# Patient Record
Sex: Female | Born: 1980 | Race: Black or African American | Hispanic: No | Marital: Married | Smoking: Never smoker
Health system: Southern US, Community
[De-identification: ages and names within clinical notes are randomized; demographics above are authoritative.]

## PROBLEM LIST (undated history)

## (undated) DIAGNOSIS — Z6835 Body mass index (BMI) 35.0-35.9, adult: Secondary | ICD-10-CM

## (undated) HISTORY — PX: THYROIDECTOMY: SHX17

## (undated) HISTORY — DX: Body mass index (BMI) 35.0-35.9, adult: Z68.35

---

## 2020-05-31 ENCOUNTER — Encounter (HOSPITAL_COMMUNITY): Payer: Self-pay

## 2020-05-31 ENCOUNTER — Emergency Department (HOSPITAL_COMMUNITY)
Admission: EM | Admit: 2020-05-31 | Discharge: 2020-05-31 | Disposition: A | Discharge status: Home / Self Care | Payer: BC Managed Care – PPO | Attending: Emergency Medicine | Admitting: Emergency Medicine

## 2020-05-31 ENCOUNTER — Other Ambulatory Visit: Payer: Self-pay

## 2020-05-31 DIAGNOSIS — U071 COVID-19: Secondary | ICD-10-CM | POA: Diagnosis not present

## 2020-05-31 DIAGNOSIS — R509 Fever, unspecified: Secondary | ICD-10-CM | POA: Diagnosis present

## 2020-05-31 LAB — RESP PANEL BY RT-PCR (FLU A&B, COVID) ARPGX2
Influenza A by PCR: NEGATIVE
Influenza B by PCR: NEGATIVE
SARS Coronavirus 2 by RT PCR: POSITIVE — AB

## 2020-05-31 MED ORDER — ACETAMINOPHEN 325 MG PO TABS
650.0000 mg | ORAL_TABLET | Freq: Once | ORAL | Status: AC | PRN
  Administered 2020-05-31: 650 mg via ORAL
  Filled 2020-05-31: qty 2

## 2020-05-31 NOTE — Discharge Instructions (Signed)
You are seen in the emergency department for Covid-like symptoms.  You tested positive for Covid.  You will need to isolate for 10 days.  We placed a referral into the infusion center and they may reach out to you in the next day or 2 to set up an antibody infusion.  There is also a Covid clinic at [facility].  Please drink plenty of fluids, rest, Tylenol as needed for fever and pain.  Return to the emergency department if any worsening or concerning symptoms

## 2020-05-31 NOTE — ED Provider Notes (Signed)
[facility] EMERGENCY DEPARTMENT Provider Note   CSN: [id number] Arrival date & time: 05/31/20  1134     History Chief Complaint  Patient presents with  . Fever  . Generalized Body Aches    Darlene Osborn is a 39 y.o. female.  She is here with complaint of a possible sinus infection that started yesterday.  She is got pain over her left eye along with fever to 102, body aches, nasal congestion.  Has tried nothing for it.  She said she gets sinus infection once a year.  Unfortunately she is also not Covid vaccinated was febrile in triage and given Tylenol.  The history is provided by the patient.  URI Presenting symptoms: congestion, facial pain, fever, rhinorrhea and sore throat   Presenting symptoms: no cough   Fever:    Duration:  2 days   Timing:  Intermittent   Max temp prior to arrival:  102.2   Temp source:  Oral   Progression:  Unchanged Severity:  Mild Onset quality:  Gradual Timing:  Intermittent Progression:  Unchanged Chronicity:  New Relieved by:  None tried Worsened by:  Nothing Ineffective treatments:  None tried Associated symptoms: sinus pain   Associated symptoms: no neck pain   Risk factors: no sick contacts        History reviewed. No pertinent past medical history.  There are no problems to display for this patient.   History reviewed. No pertinent surgical history.   OB History   No obstetric history on file.     No family history on file.     Home Medications Prior to Admission medications   Not on File    Allergies    Patient has no allergy information on record.  Review of Systems   Review of Systems  Constitutional: Positive for chills and fever.  HENT: Positive for congestion, rhinorrhea, sinus pain and sore throat.   Eyes: Negative for visual disturbance.  Respiratory: Negative for cough.   Cardiovascular: Negative for chest pain.  Gastrointestinal: Negative for diarrhea.  Genitourinary: Negative  for dysuria.  Musculoskeletal: Negative for neck pain.  Skin: Negative for rash.  Neurological: Negative for syncope.    Physical Exam Updated Vital Signs BP 115/70   Pulse 81   Temp (!) 102.2 F (39 C) (Oral)   Resp 16   Ht 5\' 3"  (1.6 m)   Wt 90.7 kg   SpO2 96%   BMI 35.43 kg/m   Physical Exam Vitals and nursing note reviewed.  Constitutional:      General: She is not in acute distress.    Appearance: Normal appearance. She is well-developed and well-nourished.  HENT:     Head: Normocephalic and atraumatic.     Nose: Congestion present.     Mouth/Throat:     Mouth: Oropharynx is clear and moist. Mucous membranes are moist.     Pharynx: No oropharyngeal exudate or posterior oropharyngeal erythema.  Eyes:     Conjunctiva/sclera: Conjunctivae normal.  Cardiovascular:     Rate and Rhythm: Normal rate and regular rhythm.     Pulses: Intact distal pulses.     Heart sounds: No murmur heard.   Pulmonary:     Effort: Pulmonary effort is normal. No respiratory distress.     Breath sounds: Normal breath sounds. No stridor. No wheezing.  Abdominal:     Palpations: Abdomen is soft.     Tenderness: There is no abdominal tenderness.  Musculoskeletal:  General: No tenderness or edema. Normal range of motion.     Cervical back: Neck supple.  Skin:    General: Skin is warm and dry.     Capillary Refill: Capillary refill takes less than 2 seconds.  Neurological:     General: No focal deficit present.     Mental Status: She is alert.     GCS: GCS eye subscore is 4. GCS verbal subscore is 5. GCS motor subscore is 6.  Psychiatric:        Mood and Affect: Mood and affect normal.     ED Results / Procedures / Treatments   Labs (all labs ordered are listed, but only abnormal results are displayed) Labs Reviewed  RESP PANEL BY RT-PCR (FLU A&B, COVID) ARPGX2 - Abnormal; Notable for the following components:      Result Value   SARS Coronavirus 2 by RT PCR POSITIVE (*)     All other components within normal limits    EKG None  Radiology No results found.  Procedures Procedures (including critical care time)  Medications Ordered in ED Medications  acetaminophen (TYLENOL) tablet 650 mg (650 mg Oral Given 05/31/20 1145)    ED Course  I have reviewed the triage vital signs and the nursing notes.  Pertinent labs & imaging results that were available during my care of the patient were reviewed by me and considered in my medical decision making (see chart for details).    MDM Rules/Calculators/A&P                         Darlene Osborn was evaluated in Emergency Department on 05/31/2020 for the symptoms described in the history of present illness. She was evaluated in the context of the global COVID-19 pandemic, which necessitated consideration that the patient might be at risk for infection with the SARS-CoV-2 virus that causes COVID-19. Institutional protocols and algorithms that pertain to the evaluation of patients at risk for COVID-19 are in a state of rapid change based on information released by regulatory bodies including the CDC and federal and state organizations. These policies and algorithms were followed during the patient's care in the ED.  Differential diagnosis includes Covid, pneumonia, viral syndrome, influenza. Final Clinical Impression(s) / ED Diagnoses Final diagnoses:  COVID-19 virus infection    Rx / DC Orders ED Discharge Orders    None       Darlene Files, MD 05/31/20 2015

## 2020-05-31 NOTE — ED Triage Notes (Signed)
Pt reports generalized body aches, congestion, chills and fever since yesterday. Pt is not vaccinated for COVID. Febrile in triage

## 2020-05-31 NOTE — ED Notes (Signed)
Patient verbalizes understanding of discharge instructions. Opportunity for questioning and answers were provided. Pt discharged from ED. 

## 2020-06-01 ENCOUNTER — Encounter: Payer: Self-pay | Admitting: Physician Assistant

## 2020-06-08 ENCOUNTER — Other Ambulatory Visit: Payer: Self-pay

## 2020-06-08 ENCOUNTER — Emergency Department (HOSPITAL_COMMUNITY): Payer: BC Managed Care – PPO

## 2020-06-08 ENCOUNTER — Emergency Department (HOSPITAL_COMMUNITY)
Admission: EM | Admit: 2020-06-08 | Discharge: 2020-06-09 | Disposition: A | Discharge status: Home / Self Care | Payer: BC Managed Care – PPO | Attending: Emergency Medicine | Admitting: Emergency Medicine

## 2020-06-08 ENCOUNTER — Encounter (HOSPITAL_COMMUNITY): Payer: Self-pay | Admitting: Emergency Medicine

## 2020-06-08 DIAGNOSIS — R42 Dizziness and giddiness: Secondary | ICD-10-CM

## 2020-06-08 DIAGNOSIS — E860 Dehydration: Secondary | ICD-10-CM | POA: Insufficient documentation

## 2020-06-08 DIAGNOSIS — U071 COVID-19: Secondary | ICD-10-CM | POA: Diagnosis not present

## 2020-06-08 LAB — CBC WITH DIFFERENTIAL/PLATELET
Abs Immature Granulocytes: 0.01 10*3/uL (ref 0.00–0.07)
Basophils Absolute: 0 10*3/uL (ref 0.0–0.1)
Basophils Relative: 0 %
Eosinophils Absolute: 0 10*3/uL (ref 0.0–0.5)
Eosinophils Relative: 0 %
HCT: 41 % (ref 36.0–46.0)
Hemoglobin: 13.2 g/dL (ref 12.0–15.0)
Immature Granulocytes: 0 %
Lymphocytes Relative: 40 %
Lymphs Abs: 1.7 10*3/uL (ref 0.7–4.0)
MCH: 23.7 pg — ABNORMAL LOW (ref 26.0–34.0)
MCHC: 32.2 g/dL (ref 30.0–36.0)
MCV: 73.5 fL — ABNORMAL LOW (ref 80.0–100.0)
Monocytes Absolute: 0.3 10*3/uL (ref 0.1–1.0)
Monocytes Relative: 8 %
Neutro Abs: 2.1 10*3/uL (ref 1.7–7.7)
Neutrophils Relative %: 52 %
Platelets: 211 10*3/uL (ref 150–400)
RBC: 5.58 MIL/uL — ABNORMAL HIGH (ref 3.87–5.11)
RDW: 15.8 % — ABNORMAL HIGH (ref 11.5–15.5)
WBC: 4.2 10*3/uL (ref 4.0–10.5)
nRBC: 0 % (ref 0.0–0.2)

## 2020-06-08 LAB — COMPREHENSIVE METABOLIC PANEL
ALT: 32 U/L (ref 0–44)
AST: 28 U/L (ref 15–41)
Albumin: 3.4 g/dL — ABNORMAL LOW (ref 3.5–5.0)
Alkaline Phosphatase: 55 U/L (ref 38–126)
Anion gap: 10 (ref 5–15)
BUN: 12 mg/dL (ref 6–20)
CO2: 24 mmol/L (ref 22–32)
Calcium: 8 mg/dL — ABNORMAL LOW (ref 8.9–10.3)
Chloride: 101 mmol/L (ref 98–111)
Creatinine, Ser: 0.83 mg/dL (ref 0.44–1.00)
GFR, Estimated: >60 mL/min (ref >60)
Glucose, Bld: 117 mg/dL — ABNORMAL HIGH (ref 70–99)
Potassium: 3.2 mmol/L — ABNORMAL LOW (ref 3.5–5.1)
Sodium: 135 mmol/L (ref 135–145)
Total Bilirubin: 0.5 mg/dL (ref 0.3–1.2)
Total Protein: 6.3 g/dL — ABNORMAL LOW (ref 6.5–8.1)

## 2020-06-08 NOTE — ED Triage Notes (Signed)
Pt c/o continued dizziness, fever, body aches. Covid +, dx 12/25.

## 2020-06-09 DIAGNOSIS — U071 COVID-19: Secondary | ICD-10-CM | POA: Diagnosis not present

## 2020-06-09 MED ORDER — LACTATED RINGERS IV BOLUS
1000.0000 mL | Freq: Once | INTRAVENOUS | Status: AC
  Administered 2020-06-09: 1000 mL via INTRAVENOUS

## 2020-06-09 MED ORDER — ACETAMINOPHEN 500 MG PO TABS
1000.0000 mg | ORAL_TABLET | Freq: Once | ORAL | Status: AC
  Administered 2020-06-09: 1000 mg via ORAL
  Filled 2020-06-09: qty 2

## 2020-06-09 MED ORDER — POTASSIUM CHLORIDE CRYS ER 20 MEQ PO TBCR
40.0000 meq | EXTENDED_RELEASE_TABLET | Freq: Once | ORAL | Status: AC
  Administered 2020-06-09: 40 meq via ORAL
  Filled 2020-06-09: qty 2

## 2020-06-09 MED ORDER — ACETAMINOPHEN 325 MG PO TABS
650.0000 mg | ORAL_TABLET | Freq: Once | ORAL | Status: AC
  Administered 2020-06-09: 650 mg via ORAL
  Filled 2020-06-09: qty 2

## 2020-06-09 NOTE — ED Notes (Signed)
Patient Alert and oriented to baseline. Stable and ambulatory to baseline. Patient verbalized understanding of the discharge instructions.  Patient belongings were taken by the patient.   

## 2020-06-09 NOTE — ED Notes (Signed)
Pt oxygen remained 98% and above while ambulating. Pt states she feels much better.

## 2020-06-09 NOTE — ED Provider Notes (Signed)
[facility] EMERGENCY DEPARTMENT Provider Note   CSN: [id number] Arrival date & time: 06/08/20  2139     History Chief Complaint  Patient presents with  . Dizziness    Darlene Osborn is a 40 y.o. female with a past medical history of obesity who presents today for evaluation of 9 days of COVID-19 symptoms.  She developed symptoms on 12/24.  She did not get vaccinated.  She states that she was given paperwork saying that she can go back tomorrow however she has 1/2-hour commute and feels lightheaded and does not feel safe to drive that distance.  She states that she has been unable to clearly identify any triggering factors.  She denies nausea vomiting or diarrhea however does note overall poor appetite stating that she will eat 2 bites: Drink 2 bites and then has no more appetite.  She denies any leg swelling.  She denies any personal history of DVT/PE.  No significant chest pain or shortness of breath.  She reports she is continuing to have fevers.  She feels like she is dehydrated.  Chart review shows that on 03/28/2020, her last visit that I can see prior to her developing Covid, at that point her blood pressure was 104/65.    HPI     Past Medical History:  Diagnosis Date  . BMI 35.0-35.9,adult     There are no problems to display for this patient.   History reviewed. No pertinent surgical history.   OB History   No obstetric history on file.     No family history on file.  Social History   Tobacco Use  . Smoking status: Never Smoker  . Smokeless tobacco: Never Used  Substance Use Topics  . Alcohol use: Not Currently  . Drug use: Not Currently    Home Medications Prior to Admission medications   Medication Sig Start Date End Date Taking? Authorizing Provider  acetaminophen (TYLENOL) 500 MG tablet Take 1,000 mg by mouth daily.   Yes [provider]  levonorgestrel (MIRENA) 20 MCG/24HR IUD 1 each by Intrauterine route once.   Yes  [provider]    Allergies    Iodine  Review of Systems   Review of Systems  Constitutional: Positive for appetite change, chills, fatigue and fever.  Respiratory: Positive for cough. Negative for chest tightness and shortness of breath.   Cardiovascular: Negative for chest pain, palpitations and leg swelling.  Gastrointestinal: Negative for abdominal pain, diarrhea and nausea.  Musculoskeletal: Positive for arthralgias and myalgias.  Skin: Negative for color change and rash.  Neurological: Positive for light-headedness. Negative for dizziness, seizures, syncope, weakness and headaches.  All other systems reviewed and are negative.   Physical Exam Updated Vital Signs BP 105/62   Pulse 78   Temp 98.5 F (36.9 C) (Oral)   Resp 17   SpO2 100%   Physical Exam Vitals and nursing note reviewed.  Constitutional:      General: She is not in acute distress.    Appearance: She is not ill-appearing or diaphoretic.  HENT:     Head: Normocephalic and atraumatic.  Eyes:     General: No scleral icterus.       Right eye: No discharge.        Left eye: No discharge.     Conjunctiva/sclera: Conjunctivae normal.  Cardiovascular:     Rate and Rhythm: Normal rate and regular rhythm.     Pulses: Normal pulses.     Heart sounds: Normal heart  sounds.  Pulmonary:     Effort: Pulmonary effort is normal. No respiratory distress.     Breath sounds: Normal breath sounds. No stridor.  Chest:     Chest wall: No tenderness.  Abdominal:     General: There is no distension.     Tenderness: There is no abdominal tenderness. There is no guarding.  Musculoskeletal:        General: No deformity.     Cervical back: Normal range of motion and neck supple.     Right lower leg: No edema.     Left lower leg: No edema.  Skin:    General: Skin is warm and dry.  Neurological:     General: No focal deficit present.     Mental Status: She is alert.     Motor: No abnormal muscle tone.      Comments: Awake and alert, answers all questions appropriately.  Speech is not slurred.  Psychiatric:        Mood and Affect: Mood normal.        Behavior: Behavior normal.     ED Results / Procedures / Treatments   Labs (all labs ordered are listed, but only abnormal results are displayed) Labs Reviewed  CBC WITH DIFFERENTIAL/PLATELET - Abnormal; Notable for the following components:      Result Value   RBC 5.58 (*)    MCV 73.5 (*)    MCH 23.7 (*)    RDW 15.8 (*)    All other components within normal limits  COMPREHENSIVE METABOLIC PANEL - Abnormal; Notable for the following components:   Potassium 3.2 (*)    Glucose, Bld 117 (*)    Calcium 8.0 (*)    Total Protein 6.3 (*)    Albumin 3.4 (*)    All other components within normal limits    EKG None  Radiology DG Chest Portable 1 View  Result Date: 06/08/2020 CLINICAL DATA:  Dizziness EXAM: PORTABLE CHEST 1 VIEW COMPARISON:  None. FINDINGS: The heart size and mediastinal contours are within normal limits. Both lungs are clear. The visualized skeletal structures are unremarkable. IMPRESSION: No active disease. Electronically Signed   By: Jasmine Pang M.D.   On: 06/08/2020 22:23    Procedures Procedures (including critical care time)  Medications Ordered in ED Medications  acetaminophen (TYLENOL) tablet 650 mg (650 mg Oral Given 06/09/20 0037)  lactated ringers bolus 1,000 mL (0 mLs Intravenous Stopped 06/09/20 1516)  potassium chloride SA (KLOR-CON) CR tablet 40 mEq (40 mEq Oral Given 06/09/20 1249)  acetaminophen (TYLENOL) tablet 1,000 mg (1,000 mg Oral Given 06/09/20 1249)    ED Course  I have reviewed the triage vital signs and the nursing notes.  Pertinent labs & imaging results that were available during my care of the patient were reviewed by me and considered in my medical decision making (see chart for details).    MDM Rules/Calculators/A&P                         Darlene Osborn was evaluated in Emergency  Department on 06/09/2020 for the symptoms described in the history of present illness. She was evaluated in the context of the global COVID-19 pandemic, which necessitated consideration that the patient might be at risk for infection with the SARS-CoV-2 virus that causes COVID-19. Institutional protocols and algorithms that pertain to the evaluation of patients at risk for COVID-19 are in a state of rapid change based on information released by regulatory  bodies including the CDC and federal and state organizations. These policies and algorithms were followed during the patient's care in the ED.  Patient is a 40 year old woman who presents today for evaluation of lightheadedness.  She is on day 9 of COVID-19 symptoms and did not get vaccinated.  At the time of my evaluation she has been in the waiting room for over 14 hours. She does have intermittent tachycardia noted, however this appears to be when her temperature is higher and she is still febrile.  Chest x-ray without consolidation, pneumothorax or other acute abnormality.  Labs obtained show mild hypokalemia with potassium of 3.2 which is orally repleted.  Suspect this is due to poor p.o. intake.  Suspect that patient is dehydrated in the setting of poor p.o. intake.  Patient treated with tylenol, PO intake, IV fluids and PO potassium.    After IV fluids and treatment patient reported feeling much better.  She was able to ambulate on pulse ox on room air with out desaturations or dyspnea.    Return precautions were discussed with patient who states their understanding.  At the time of discharge patient denied any unaddressed complaints or concerns.  Patient is agreeable for discharge home.  Note: Portions of this report may have been transcribed using voice recognition software. Every effort was made to ensure accuracy; however, inadvertent computerized transcription errors may be present   Final Clinical Impression(s) / ED Diagnoses Final  diagnoses:  COVID-19  Lightheadedness  Dehydration    Rx / DC Orders ED Discharge Orders    None       Norman Clay 06/09/20 2139    Rolan Bucco, MD 06/12/20 1645

## 2020-06-09 NOTE — Discharge Instructions (Addendum)
Please make sure you are drinking plenty of water at home to stay hydrated. Today your potassium was slightly low which is most likely due to not eating and drinking well.  Even if you do not feel hungry or thirsty it is important that you are still trying to eat and drink.  If your symptoms worsen, you develop significant shortness of breath, chest pain, passout or have any other concerns please seek additional medical care and evaluation.  Please take Tylenol (acetaminophen) to relieve your pain/fever.  You may take tylenol, up to 1,000 mg (two extra strength pills).  Do not take more than 3,000 mg tylenol in a 24 hour period.  Please check all medication labels as many medications such as pain and cold medications may contain tylenol. Please do not drink alcohol while taking this medication.

## 2020-06-10 ENCOUNTER — Telehealth: Payer: Self-pay | Admitting: General Practice

## 2020-06-10 NOTE — Telephone Encounter (Signed)
[facility] [phone] called pt LVM to schedule HFU appt.

## 2021-08-05 IMAGING — DX DG CHEST 1V PORT
1 series · 1 of 1 positions shown · non-contrast
Comparison: None.

CLINICAL DATA: Dizziness

EXAM:
PORTABLE CHEST 1 VIEW

[chest ap]
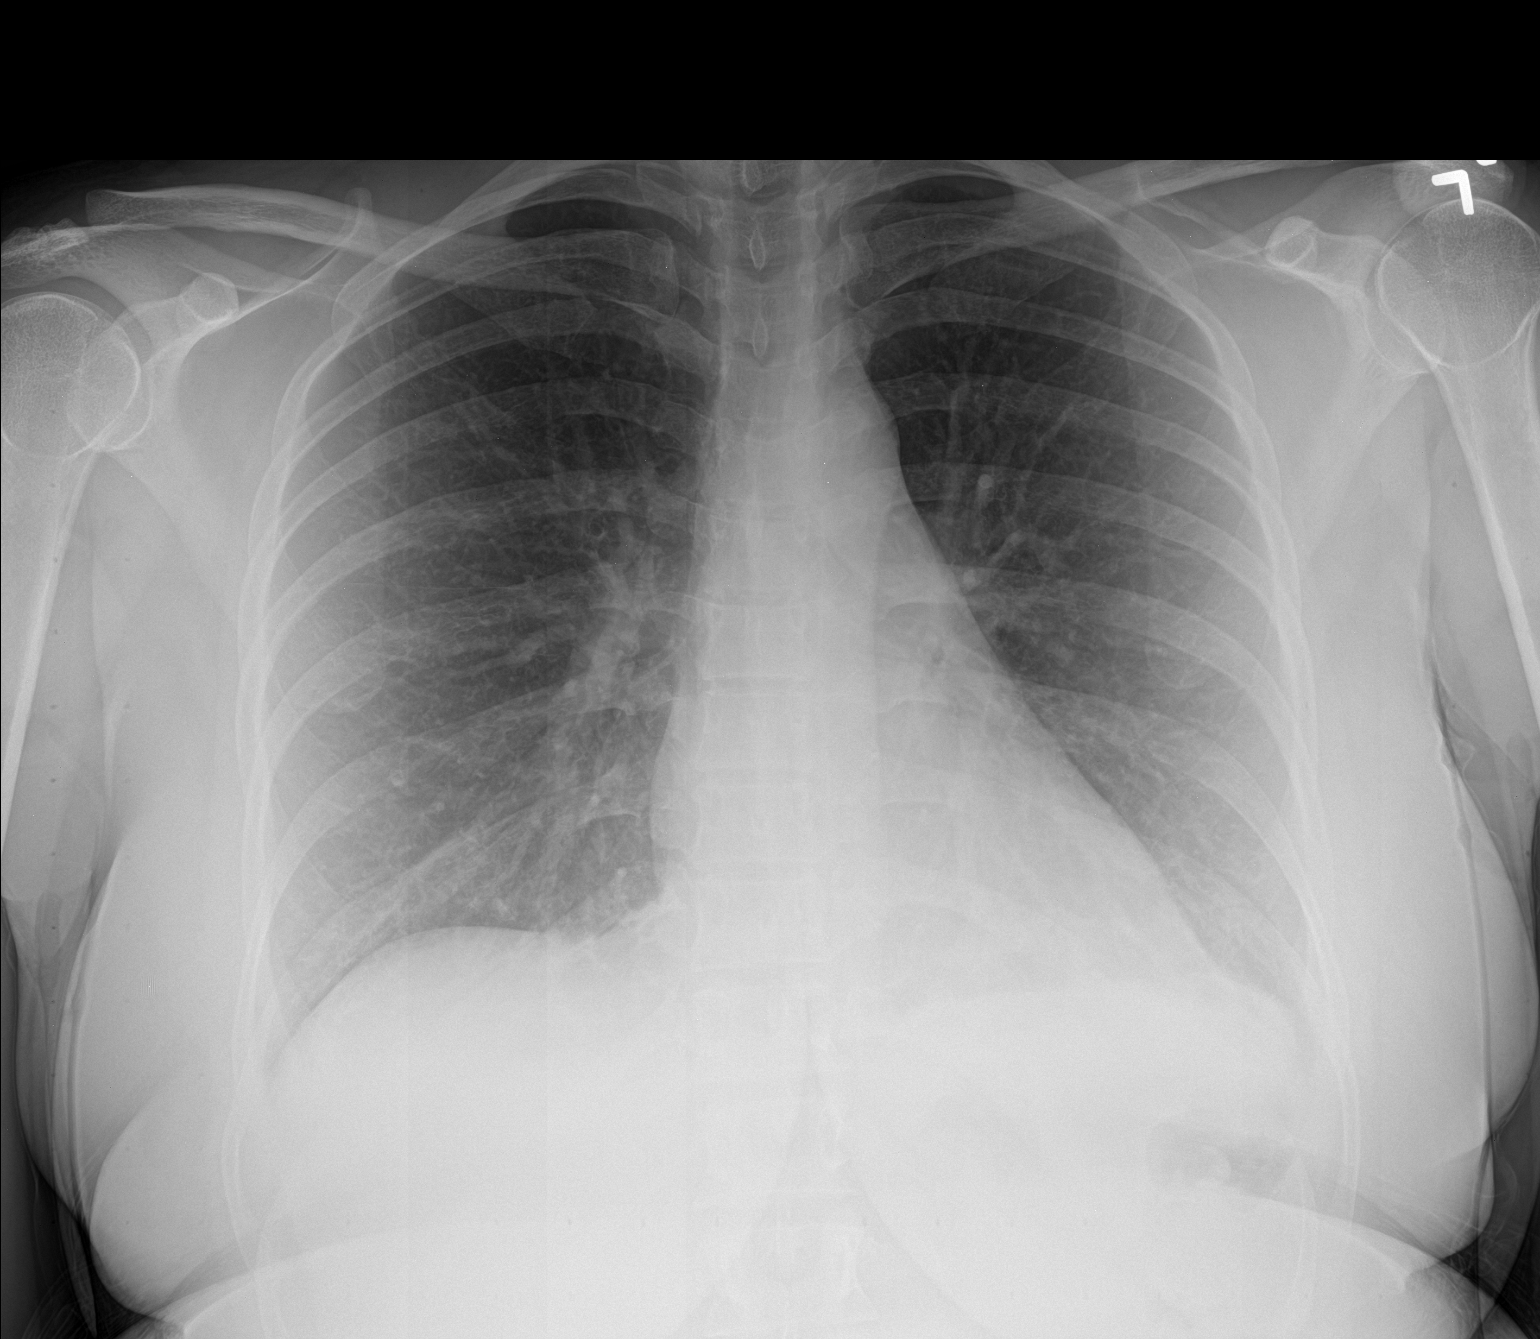

[1 of 1 positions shown; findings below may reference images not displayed]

FINDINGS: The heart size and mediastinal contours are within normal limits.
Both lungs are clear. The visualized skeletal structures are
unremarkable.
IMPRESSION: No active disease.

## 2021-11-28 ENCOUNTER — Encounter (HOSPITAL_COMMUNITY): Payer: Self-pay | Admitting: Emergency Medicine

## 2021-11-28 ENCOUNTER — Ambulatory Visit (HOSPITAL_COMMUNITY)
Admission: EM | Admit: 2021-11-28 | Discharge: 2021-11-28 | Disposition: A | Discharge status: Home / Self Care | Payer: BC Managed Care – PPO | Attending: Emergency Medicine | Admitting: Emergency Medicine

## 2021-11-28 DIAGNOSIS — M79671 Pain in right foot: Secondary | ICD-10-CM | POA: Diagnosis not present

## 2021-11-28 MED ORDER — KETOROLAC TROMETHAMINE 30 MG/ML IJ SOLN
INTRAMUSCULAR | Status: AC
  Filled 2021-11-28: qty 1

## 2021-11-28 MED ORDER — CYCLOBENZAPRINE HCL 10 MG PO TABS: 10.0000 mg | ORAL_TABLET | Freq: Every day | ORAL | 0 refills | Status: AC

## 2021-11-28 MED ORDER — PREDNISONE 20 MG PO TABS: 40.0000 mg | ORAL_TABLET | Freq: Every day | ORAL | 0 refills | Status: AC

## 2021-11-28 MED ORDER — KETOROLAC TROMETHAMINE 30 MG/ML IJ SOLN
30.0000 mg | Freq: Once | INTRAMUSCULAR | Status: AC
  Administered 2021-11-28: 30 mg via INTRAMUSCULAR

## 2021-11-28 NOTE — ED Triage Notes (Addendum)
Right foot pain to dorsal/anterior side of foot starting two days ago with no known causative factor. 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 Pain started suddenly, but progressed gradually. Antalgic gait present. Patient requests to hold off on x-ray until evaluated by provider.

## 2021-11-28 NOTE — ED Provider Notes (Signed)
[facility]    CSN: [id number] Arrival date & time: 11/28/21  1026      History   Chief Complaint Chief Complaint  Patient presents with   Foot Pain    HPI Darlene Osborn is a 41 y.o. female.   Patient presents with right foot pain beginning 2 days ago without precipitating event or trauma.  Pain is present on the dorsum of the foot.  endorses that symptoms are predominantly present when walking and becomes significantly milder when at rest.  Able to bear weight.  Pain can be felt with range of motion of toes.  Has attempted use of Goody's powder which has not been helpful.  Denies numbness or tingling .  Past Medical History:  Diagnosis Date   BMI 35.0-35.9,adult     There are no problems to display for this patient.   History reviewed. No pertinent surgical history.  OB History   No obstetric history on file.      Home Medications    Prior to Admission medications   Medication Sig Start Date End Date Taking? Authorizing Provider  acetaminophen (TYLENOL) 500 MG tablet Take 1,000 mg by mouth daily.    [provider]  levonorgestrel (MIRENA) 20 MCG/24HR IUD 1 each by Intrauterine route once.    [provider]    Family History History reviewed. No pertinent family history.  Social History Social History   Tobacco Use   Smoking status: Never   Smokeless tobacco: Never  Substance Use Topics   Alcohol use: Not Currently   Drug use: Not Currently     Allergies   Iodine   Review of Systems Review of Systems Defer to HPI    Physical Exam Triage Vital Signs ED Triage Vitals  Enc Vitals Group     BP 11/28/21 1111 108/72     Pulse Rate 11/28/21 1111 79     Resp 11/28/21 1111 16     Temp 11/28/21 1111 98.1 F (36.7 C)     Temp Source 11/28/21 1111 Oral     SpO2 11/28/21 1111 98 %     Weight --      Height --      Head Circumference --      Peak Flow --      Pain Score 11/28/21 1112 0     Pain Loc --      Pain  Edu? --      Excl. in GC? --    No data found.  Updated Vital Signs BP 108/72 (BP Location: Right Arm)   Pulse 79   Temp 98.1 F (36.7 C) (Oral)   Resp 16   SpO2 98%   Visual Acuity Right Eye Distance:   Left Eye Distance:   Bilateral Distance:    Right Eye Near:   Left Eye Near:    Bilateral Near:     Physical Exam Constitutional:      Appearance: Normal appearance.  HENT:     Head: Normocephalic.  Eyes:     Extraocular Movements: Extraocular movements intact.  Pulmonary:     Effort: Pulmonary effort is normal.  Feet:     Comments: Tenderness is present along the dorsal aspect of the right midfoot without ecchymosis, swelling or deformity noted, no involvement or ankle, able to bear weight, sensation is intact, 2+ pedal pulse Skin:    General: Skin is warm and dry.  Neurological:     Mental Status: She is alert and oriented to person,  place, and time. Mental status is at baseline.  Psychiatric:        Mood and Affect: Mood normal.        Behavior: Behavior normal.      UC Treatments / Results  Labs (all labs ordered are listed, but only abnormal results are displayed) Labs Reviewed - No data to display  EKG   Radiology No results found.  Procedures Procedures (including critical care time)  Medications Ordered in UC Medications - No data to display  Initial Impression / Assessment and Plan / UC Course  I have reviewed the triage vital signs and the nursing notes.  Pertinent labs & imaging results that were available during my care of the patient were reviewed by me and considered in my medical decision making (see chart for details).  right foot pain  As there is no injury we will defer imaging, discussed with patient, Toradol injection given in office and prednisone and Flexeril prescribed for outpatient use, recommended RICE a compression wrap applied in the office by this provider given walker referral to podiatry or orthopedics if symptoms  continue to persist or worsen Final Clinical Impressions(s) / UC Diagnoses   Final diagnoses:  None   Discharge Instructions   None    ED Prescriptions   None    PDMP not reviewed this encounter.   Valinda Hoar, [place] 11/28/21 1609

## 2022-09-07 ENCOUNTER — Other Ambulatory Visit: Payer: Self-pay

## 2022-09-07 ENCOUNTER — Emergency Department (HOSPITAL_BASED_OUTPATIENT_CLINIC_OR_DEPARTMENT_OTHER)
Admission: EM | Admit: 2022-09-07 | Discharge: 2022-09-07 | Disposition: A | Discharge status: Home / Self Care | Payer: BC Managed Care – PPO | Attending: Emergency Medicine | Admitting: Emergency Medicine

## 2022-09-07 DIAGNOSIS — J329 Chronic sinusitis, unspecified: Secondary | ICD-10-CM | POA: Diagnosis not present

## 2022-09-07 DIAGNOSIS — R059 Cough, unspecified: Secondary | ICD-10-CM | POA: Diagnosis present

## 2022-09-07 MED ORDER — AZITHROMYCIN 250 MG PO TABS: 250.0000 mg | ORAL_TABLET | Freq: Every day | ORAL | 0 refills | Status: AC

## 2022-09-07 MED ORDER — ALBUTEROL SULFATE HFA 108 (90 BASE) MCG/ACT IN AERS
1.0000 | INHALATION_SPRAY | Freq: Once | RESPIRATORY_TRACT | Status: AC
  Administered 2022-09-07: 1 via RESPIRATORY_TRACT
  Filled 2022-09-07: qty 6.7

## 2022-09-07 MED ORDER — FLUTICASONE PROPIONATE 50 MCG/ACT NA SUSP: 1.0000 | Freq: Every day | NASAL | 2 refills | Status: AC

## 2022-09-07 NOTE — ED Triage Notes (Signed)
Ambulatory to triage. NAD. A+Ox4.  Nasal drainage, congestion, cough. Full filling and soreness in both ears- left ear worse. Since 3/28 Has tried several OTC medications- mucinex, alkaseltzer, and allegra. No fevers at home. Hx seasonal allergies.

## 2022-09-07 NOTE — Discharge Instructions (Signed)
You have been seen and discharged from the emergency department.  Take antibiotic as directed.  Use nasal spray as prescribed.  Use inhaler as needed.  You may take Tylenol/ibuprofen for pain control.  In addition it is recommended that you take a daily allergy pill.  Follow-up with your primary provider for further evaluation and further care. Take home medications as prescribed. If you have any worsening symptoms or further concerns for your health please return to an emergency department for further evaluation.

## 2022-09-07 NOTE — ED Provider Notes (Addendum)
[facility] Provider Note   CSN: [id number] Arrival date & time: 09/07/22  1758     History  Chief Complaint  Patient presents with   Ear Fullness   Cough    Darlene Osborn is a 42 y.o. female.  HPI   42 year old female presents emergency department concern for nasal congestion, drainage, ear fullness, intermittently productive cough.  Patient states the symptoms have been ongoing about 5 days.  She is been using multiple over-the-counter medications without improvement.  No recent sick contacts.  Denies any fever at home but endorses fatigue and chills.  No swelling of her lower extremities.  No GI symptoms.  Home Medications Prior to Admission medications   Medication Sig Start Date End Date Taking? Authorizing Provider  azithromycin (ZITHROMAX Z-PAK) 250 MG tablet Take 1 tablet (250 mg total) by mouth daily. Take two tablets (500 mg total) on day one, then take on tablet (250 mg) once daily. 09/07/22  Yes Celester Morgan M, DO  fluticasone (FLONASE) 50 MCG/ACT nasal spray Place 1 spray into both nostrils daily. 09/07/22  Yes Tadeusz Stahl, Alvin Critchley, DO  acetaminophen (TYLENOL) 500 MG tablet Take 1,000 mg by mouth daily.    [provider]  cyclobenzaprine (FLEXERIL) 10 MG tablet Take 1 tablet (10 mg total) by mouth at bedtime. 11/28/21   Hans Eden, NP  levonorgestrel (MIRENA) 20 MCG/24HR IUD 1 each by Intrauterine route once.    [provider]  predniSONE (DELTASONE) 20 MG tablet Take 2 tablets (40 mg total) by mouth daily. 11/28/21   Hans Eden, NP      Allergies    Iodine    Review of Systems   Review of Systems  Constitutional:  Positive for chills and fatigue.  HENT:  Positive for congestion, rhinorrhea and sinus pressure. Negative for trouble swallowing.   Eyes:  Negative for pain.  Respiratory:  Positive for cough and wheezing.   Cardiovascular:  Negative for leg swelling.  Gastrointestinal:   Negative for diarrhea and vomiting.    Physical Exam Updated Vital Signs BP (!) 121/91 (BP Location: Left Arm)   Pulse 81   Temp 98.8 F (37.1 C) (Oral)   Resp 16   Ht 5\' 3"  (1.6 m)   Wt 78.9 kg   SpO2 98%   BMI 30.82 kg/m  Physical Exam Vitals and nursing note reviewed.  Constitutional:      Appearance: Normal appearance. She is not ill-appearing.  HENT:     Head: Normocephalic.     Right Ear: Tympanic membrane and external ear normal.     Left Ear: Tympanic membrane and external ear normal.     Nose: Congestion and rhinorrhea present.     Mouth/Throat:     Mouth: Mucous membranes are moist.     Pharynx: No posterior oropharyngeal erythema.  Eyes:     Extraocular Movements: Extraocular movements intact.     Pupils: Pupils are equal, round, and reactive to light.  Cardiovascular:     Rate and Rhythm: Normal rate.  Pulmonary:     Effort: Pulmonary effort is normal. No respiratory distress.     Comments: Scattered expiratory wheezes especially with coughing Abdominal:     Palpations: Abdomen is soft.     Tenderness: There is no abdominal tenderness.  Musculoskeletal:        General: No swelling.     Cervical back: Normal range of motion.  Skin:    General: Skin is warm.  Neurological:     Mental Status: She is alert and oriented to person, place, and time. Mental status is at baseline.  Psychiatric:        Mood and Affect: Mood normal.     ED Results / Procedures / Treatments   Labs (all labs ordered are listed, but only abnormal results are displayed) Labs Reviewed - No data to display  EKG None  Radiology No results found.  Procedures Procedures    Medications Ordered in ED Medications  albuterol (VENTOLIN HFA) 108 (90 Base) MCG/ACT inhaler 1 puff (1 puff Inhalation Given 09/07/22 2240)    ED Course/ Medical Decision Making/ A&P                             Medical Decision Making Risk Prescription drug management.   41 year old female  presents emergency department with sinus congestion, rhinorrhea, bilateral foot pain/fullness, cough and wheezing.  Patient is afebrile on arrival, normal vitals.  Fatigued appearing but overall well.  Appears to be suffering from sinusitis on exam with some end expiratory wheezing and profuse cough.  Will plan to treat symptomatically. She denies possibility of pregnancy.  Patient at this time appears safe and stable for discharge and close outpatient follow up. Discharge plan and strict return to ED precautions discussed, patient verbalizes understanding and agreement.        Final Clinical Impression(s) / ED Diagnoses Final diagnoses:  Sinusitis, unspecified chronicity, unspecified location    Rx / DC Orders ED Discharge Orders          Ordered    azithromycin (ZITHROMAX Z-PAK) 250 MG tablet  Daily        09/07/22 2246    fluticasone (FLONASE) 50 MCG/ACT nasal spray  Daily        09/07/22 2246              Juliana Boling, Alvin Critchley, DO 09/07/22 2329    Jaymari Cromie, Alvin Critchley, DO 09/07/22 2330

## 2022-09-15 ENCOUNTER — Emergency Department (HOSPITAL_BASED_OUTPATIENT_CLINIC_OR_DEPARTMENT_OTHER)
Admission: EM | Admit: 2022-09-15 | Discharge: 2022-09-15 | Disposition: A | Discharge status: Home / Self Care | Payer: BC Managed Care – PPO | Attending: Emergency Medicine | Admitting: Emergency Medicine

## 2022-09-15 ENCOUNTER — Other Ambulatory Visit: Payer: Self-pay

## 2022-09-15 DIAGNOSIS — H9202 Otalgia, left ear: Secondary | ICD-10-CM | POA: Diagnosis present

## 2022-09-15 DIAGNOSIS — H6692 Otitis media, unspecified, left ear: Secondary | ICD-10-CM | POA: Diagnosis not present

## 2022-09-15 MED ORDER — AMOXICILLIN 500 MG PO CAPS: 500.0000 mg | ORAL_CAPSULE | Freq: Three times a day (TID) | ORAL | 0 refills | Status: AC

## 2022-09-15 NOTE — ED Provider Notes (Signed)
[facility] EMERGENCY DEPARTMENT AT [facility] Provider Note   CSN: [id number] Arrival date & time: 09/15/22  0840     History  Chief Complaint  Patient presents with   Otalgia    Darlene Osborn is a 42 y.o. female.  Patient presents with worsening left ear pain and pressure.  Recent sinus congestion/allergies and on azithromycin currently.  Decreased hearing.  Only relief is when she blows her nose.  No fevers.       Home Medications Prior to Admission medications   Medication Sig Start Date End Date Taking? Authorizing Provider  amoxicillin (AMOXIL) 500 MG capsule Take 1 capsule (500 mg total) by mouth 3 (three) times daily for 7 days. 09/15/22 09/22/22 Yes Blane Ohara, MD  acetaminophen (TYLENOL) 500 MG tablet Take 1,000 mg by mouth daily.    [provider]  azithromycin (ZITHROMAX Z-PAK) 250 MG tablet Take 1 tablet (250 mg total) by mouth daily. Take two tablets (500 mg total) on day one, then take on tablet (250 mg) once daily. 09/07/22   Horton, Clabe Seal, DO  cyclobenzaprine (FLEXERIL) 10 MG tablet Take 1 tablet (10 mg total) by mouth at bedtime. 11/28/21   White, Elita Boone, NP  fluticasone (FLONASE) 50 MCG/ACT nasal spray Place 1 spray into both nostrils daily. 09/07/22   Horton, Clabe Seal, DO  levonorgestrel (MIRENA) 20 MCG/24HR IUD 1 each by Intrauterine route once.    [provider]  predniSONE (DELTASONE) 20 MG tablet Take 2 tablets (40 mg total) by mouth daily. 11/28/21   Valinda Hoar, NP      Allergies    Iodine    Review of Systems   Review of Systems  Constitutional:  Negative for chills and fever.  HENT:  Positive for congestion and ear pain.   Eyes:  Negative for visual disturbance.  Respiratory:  Negative for shortness of breath.   Cardiovascular:  Negative for chest pain.  Gastrointestinal:  Negative for abdominal pain and vomiting.  Genitourinary:  Negative for dysuria and flank pain.  Musculoskeletal:  Negative for  back pain, neck pain and neck stiffness.  Skin:  Negative for rash.  Neurological:  Negative for light-headedness and headaches.    Physical Exam Updated Vital Signs BP 109/77 (BP Location: Right Arm)   Pulse 84   Temp 97.7 F (36.5 C) (Oral)   Resp 16   Ht 5\' 3"  (1.6 m)   Wt 78.5 kg   SpO2 98%   BMI 30.65 kg/m  Physical Exam Vitals and nursing note reviewed.  Constitutional:      General: She is not in acute distress.    Appearance: She is well-developed.  HENT:     Head: Normocephalic and atraumatic.     Comments: Patient has erythema superior aspect of tympanic membrane with opaque fluid dependent portion.  No perforation or drainage.    Mouth/Throat:     Mouth: Mucous membranes are moist.  Eyes:     General:        Right eye: No discharge.        Left eye: No discharge.     Conjunctiva/sclera: Conjunctivae normal.  Neck:     Trachea: No tracheal deviation.  Cardiovascular:     Rate and Rhythm: Normal rate.  Pulmonary:     Effort: Pulmonary effort is normal.     Breath sounds: Normal breath sounds.  Abdominal:     General: There is no distension.  Musculoskeletal:     Cervical back: Normal  range of motion and neck supple. No rigidity.  Skin:    General: Skin is warm.     Capillary Refill: Capillary refill takes less than 2 seconds.  Neurological:     General: No focal deficit present.     Mental Status: She is alert.     Cranial Nerves: No cranial nerve deficit.  Psychiatric:        Mood and Affect: Mood normal.     ED Results / Procedures / Treatments   Labs (all labs ordered are listed, but only abnormal results are displayed) Labs Reviewed - No data to display  EKG None  Radiology No results found.  Procedures Procedures    Medications Ordered in ED Medications - No data to display  ED Course/ Medical Decision Making/ A&P                             Medical Decision Making Risk Prescription drug management.   Patient presents  with worsening ear pain and decreased hearing, discussed fluid/infection as a cause.  Discussed risk and benefits of antibiotics and plan for amoxicillin for 1 week.  Patient comfortable to plan and does not need a work note.  Reviewed medical records and patient azithromycin prescribed last week.        Final Clinical Impression(s) / ED Diagnoses Final diagnoses:  Acute otitis media, left    Rx / DC Orders ED Discharge Orders          Ordered    amoxicillin (AMOXIL) 500 MG capsule  3 times daily        09/15/22 1478              Blane Ohara, MD 09/15/22 [phone]

## 2022-09-15 NOTE — Discharge Instructions (Signed)
Stop taking azithromycin.  Start taking amoxicillin.  Supportive care as discussed.  Allergy medicines as needed.

## 2022-09-15 NOTE — ED Triage Notes (Signed)
Pt POV from home, caox4, ambulatory, NAD. Pt states "It feels like I had water in both my ears," c/o pressure in ears, however states it is not painful.  Pt was tx in ED 4/2 for similar complaints, has been compliant with meds since.

## 2022-09-15 NOTE — ED Notes (Signed)
Discharge paperwork given and verbally understood. 

## 2022-10-09 ENCOUNTER — Emergency Department (HOSPITAL_BASED_OUTPATIENT_CLINIC_OR_DEPARTMENT_OTHER)
Admission: EM | Admit: 2022-10-09 | Discharge: 2022-10-09 | Disposition: A | Discharge status: Home / Self Care | Payer: BC Managed Care – PPO | Attending: Emergency Medicine | Admitting: Emergency Medicine

## 2022-10-09 ENCOUNTER — Encounter (HOSPITAL_BASED_OUTPATIENT_CLINIC_OR_DEPARTMENT_OTHER): Payer: Self-pay

## 2022-10-09 DIAGNOSIS — J3089 Other allergic rhinitis: Secondary | ICD-10-CM

## 2022-10-09 DIAGNOSIS — J302 Other seasonal allergic rhinitis: Secondary | ICD-10-CM | POA: Insufficient documentation

## 2022-10-09 DIAGNOSIS — R0981 Nasal congestion: Secondary | ICD-10-CM | POA: Diagnosis present

## 2022-10-09 MED ORDER — BENZONATATE 100 MG PO CAPS: 100.0000 mg | ORAL_CAPSULE | Freq: Three times a day (TID) | ORAL | 0 refills | Status: AC

## 2022-10-09 NOTE — ED Provider Notes (Signed)
[facility] Provider Note   CSN: [id number] Arrival date & time: 10/09/22  0813     History  Chief Complaint  Patient presents with   seasonal allergies    Darlene Osborn is a 42 y.o. female with no known past medical history presenting today with concern for seasonal allergies.  She reports ever since Easter she has been having increased nasal drainage, congestion, occasional sore throat and overall discomfort.  She reports recently she was treated for an ear infection but is starting to feel ear fullness as well.  No shortness of breath.  No chest pain.  HPI     Home Medications Prior to Admission medications   Medication Sig Start Date End Date Taking? Authorizing Provider  benzonatate (TESSALON) 100 MG capsule Take 1 capsule (100 mg total) by mouth every 8 (eight) hours. 10/09/22  Yes Pranay Hilbun A, PA-C  acetaminophen (TYLENOL) 500 MG tablet Take 1,000 mg by mouth daily.    [provider]  azithromycin (ZITHROMAX Z-PAK) 250 MG tablet Take 1 tablet (250 mg total) by mouth daily. Take two tablets (500 mg total) on day one, then take on tablet (250 mg) once daily. 09/07/22   Horton, Clabe Seal, DO  cyclobenzaprine (FLEXERIL) 10 MG tablet Take 1 tablet (10 mg total) by mouth at bedtime. 11/28/21   White, Elita Boone, NP  fluticasone (FLONASE) 50 MCG/ACT nasal spray Place 1 spray into both nostrils daily. 09/07/22   Horton, Clabe Seal, DO  levonorgestrel (MIRENA) 20 MCG/24HR IUD 1 each by Intrauterine route once.    [provider]  predniSONE (DELTASONE) 20 MG tablet Take 2 tablets (40 mg total) by mouth daily. 11/28/21   Valinda Hoar, NP      Allergies    Iodine    Review of Systems   Review of Systems  Physical Exam Updated Vital Signs BP 107/80   Pulse 82   Temp 98.3 F (36.8 C) (Oral)   Resp 16   LMP  (LMP Unknown) Comment: Mirena  SpO2 100%  Physical Exam Vitals and nursing note reviewed.   Constitutional:      General: She is not in acute distress.    Appearance: Normal appearance. She is not ill-appearing.  HENT:     Head: Normocephalic and atraumatic.     Right Ear: Tympanic membrane normal.     Left Ear: Tympanic membrane normal.     Mouth/Throat:     Mouth: Mucous membranes are moist.     Pharynx: Oropharynx is clear. No oropharyngeal exudate or posterior oropharyngeal erythema.  Eyes:     General: No scleral icterus.    Conjunctiva/sclera: Conjunctivae normal.  Cardiovascular:     Rate and Rhythm: Normal rate and regular rhythm.  Pulmonary:     Effort: Pulmonary effort is normal. No respiratory distress.     Breath sounds: No wheezing.  Skin:    General: Skin is warm and dry.     Findings: No rash.  Neurological:     Mental Status: She is alert.  Psychiatric:        Mood and Affect: Mood normal.     ED Results / Procedures / Treatments   Labs (all labs ordered are listed, but only abnormal results are displayed) Labs Reviewed  SARS CORONAVIRUS 2 BY RT PCR    EKG None  Radiology No results found.  Procedures Procedures    Medications Ordered in ED Medications - No data to display  ED  Course/ Medical Decision Making/ A&P                             Medical Decision Making Risk Prescription drug management.   This is a 42 year old female presenting today with seasonal allergies.  She was also concerned she had a repeat urine for infection however no signs of infection.  Lung sounds clear, tolerating secretions.  She complained of a cough that was mostly dry.  No PE risk factors.  She was given a inhaler during her last visit and says that it sometimes helps her.  Vital signs are stable today.  Not tachycardic or febrile.  She is well-appearing on physical exam with clear lung sounds.  I do not believe she has an emergent condition.  Further workup today.  We discussed options for over-the-counter allergy treatment and she was given  referral to PCP and allergist offices in the area.  She will try an antihistamine, Flonase and DayQuil/NyQuil for any flulike symptoms.  I also prescribed her Jerilynn Som if she struggles with her cough despite over-the-counter care  Final Clinical Impression(s) / ED Diagnoses Final diagnoses:  Environmental and seasonal allergies    Rx / DC Orders ED Discharge Orders          Ordered    benzonatate (TESSALON) 100 MG capsule  Every 8 hours        10/09/22 0908           Results and diagnoses were explained to the patient. Return precautions discussed in full. Patient had no additional questions and expressed complete understanding.   This chart was dictated using voice recognition software.  Despite best efforts to proofread,  errors can occur which can change the documentation meaning.    Woodroe Chen 10/09/22 1610    Glynn Octave, MD 10/09/22 1331

## 2022-10-09 NOTE — Discharge Instructions (Signed)
You came to the ED with seasonal allergies. You should be taking a daily antihistamine (Clairitin, Zyrtec, Allegra, Xyzol), Flonase daily even if you feel better, Mucinex for congestion and dayquil/nyquil for overall cold and flu symptoms.  Return with any chest pain or difficulty breathing. An allergy clinic and a PCP office are on these papers. It was a pleasure to meet you and I hope you feel better!

## 2022-10-09 NOTE — ED Triage Notes (Signed)
She c/o ear fullness and mild congestion x 3-4 days. She is breathing normally and is in no distress.

## 2022-12-29 ENCOUNTER — Ambulatory Visit: Payer: BC Managed Care – PPO | Admitting: Internal Medicine

## 2023-07-15 ENCOUNTER — Other Ambulatory Visit: Payer: Self-pay

## 2023-07-15 ENCOUNTER — Emergency Department (HOSPITAL_BASED_OUTPATIENT_CLINIC_OR_DEPARTMENT_OTHER)
Admission: EM | Admit: 2023-07-15 | Discharge: 2023-07-15 | Disposition: A | Discharge status: Home / Self Care | Payer: 59 | Attending: Emergency Medicine | Admitting: Emergency Medicine

## 2023-07-15 ENCOUNTER — Encounter (HOSPITAL_BASED_OUTPATIENT_CLINIC_OR_DEPARTMENT_OTHER): Payer: Self-pay | Admitting: Emergency Medicine

## 2023-07-15 DIAGNOSIS — R0981 Nasal congestion: Secondary | ICD-10-CM | POA: Diagnosis present

## 2023-07-15 DIAGNOSIS — J069 Acute upper respiratory infection, unspecified: Secondary | ICD-10-CM | POA: Diagnosis not present

## 2023-07-15 DIAGNOSIS — Z20822 Contact with and (suspected) exposure to covid-19: Secondary | ICD-10-CM | POA: Insufficient documentation

## 2023-07-15 DIAGNOSIS — Z79899 Other long term (current) drug therapy: Secondary | ICD-10-CM | POA: Insufficient documentation

## 2023-07-15 LAB — RESP PANEL BY RT-PCR (RSV, FLU A&B, COVID)  RVPGX2
Influenza A by PCR: NEGATIVE
Influenza B by PCR: NEGATIVE
Resp Syncytial Virus by PCR: NEGATIVE
SARS Coronavirus 2 by RT PCR: NEGATIVE

## 2023-07-15 NOTE — Discharge Instructions (Signed)
 You have been seen today for your complaint of flulike symptoms. Your discharge medications include Claritin and Flonase .  These are over-the-counter.  Use these daily until your symptoms resolve. Follow up with: Your primary care provider in 1 week Please seek immediate medical care if you develop any of the following symptoms: You have shortness of breath that gets worse. You have severe or persistent: Headache. Ear pain. Sinus pain. Chest pain. You have chronic lung disease along with any of the following: Making high-pitched whistling sounds when you breathe, most often when you breathe out (wheezing). Prolonged cough (more than 14 days). Coughing up blood. A change in your usual mucus. You have a stiff neck. You have changes in your: Vision. Hearing. Thinking. Mood. At this time there does not appear to be the presence of an emergent medical condition, however there is always the potential for conditions to change. Please read and follow the below instructions.  Do not take your medicine if  develop an itchy rash, swelling in your mouth or lips, or difficulty breathing; call 911 and seek immediate emergency medical attention if this occurs.  You may review your lab tests and imaging results in their entirety on your MyChart account.  Please discuss all results of fully with your primary care provider and other specialist at your follow-up visit.  Note: Portions of this text may have been transcribed using voice recognition software. Every effort was made to ensure accuracy; however, inadvertent computerized transcription errors may still be present.

## 2023-07-15 NOTE — ED Triage Notes (Signed)
 C/o nasal congestion, cough, and ear draining x 2 days. Denies fevers.

## 2023-07-15 NOTE — ED Provider Notes (Signed)
 [place] EMERGENCY DEPARTMENT AT [facility] Provider Note   CSN: [id number] Arrival date & time: 07/15/23  1245     History  Chief Complaint  Patient presents with   Nasal Congestion    Darlene Osborn is a 43 y.o. female. Presenting to the ED for evaluation of congestion, cough. Symptoms began 2 days ago.  She states her 49-year-old son sick as well.  She denies any fevers.  She has been taking numerous over-the-counter medications with minimal improvement.  She is wondering if she has allergies or a sinus infection.  HPI     Home Medications Prior to Admission medications   Medication Sig Start Date End Date Taking? Authorizing Provider  acetaminophen  (TYLENOL ) 500 MG tablet Take 1,000 mg by mouth daily.    [provider]  azithromycin  (ZITHROMAX  Z-PAK) 250 MG tablet Take 1 tablet (250 mg total) by mouth daily. Take two tablets (500 mg total) on day one, then take on tablet (250 mg) once daily. 09/07/22   Horton, Roxie HERO, DO  benzonatate  (TESSALON ) 100 MG capsule Take 1 capsule (100 mg total) by mouth every 8 (eight) hours. 10/09/22   Redwine, Madison A, PA-C  cyclobenzaprine  (FLEXERIL ) 10 MG tablet Take 1 tablet (10 mg total) by mouth at bedtime. 11/28/21   Teresa Shelba SAUNDERS, NP  fluticasone  (FLONASE ) 50 MCG/ACT nasal spray Place 1 spray into both nostrils daily. 09/07/22   Horton, Kristie M, DO  levonorgestrel (MIRENA) 20 MCG/24HR IUD 1 each by Intrauterine route once.    [provider]  predniSONE  (DELTASONE ) 20 MG tablet Take 2 tablets (40 mg total) by mouth daily. 11/28/21   Teresa Shelba SAUNDERS, NP      Allergies    Iodine    Review of Systems   Review of Systems  HENT:  Positive for congestion, rhinorrhea and sore throat.   Respiratory:  Positive for cough.   All other systems reviewed and are negative.   Physical Exam Updated Vital Signs BP 113/82 (BP Location: Right Arm)   Pulse 70   Temp 98.7 F (37.1 C)   Resp 16   Ht 5' 3 (1.6 m)    Wt 83.5 kg   SpO2 99%   BMI 32.59 kg/m  Physical Exam  ED Results / Procedures / Treatments   Labs (all labs ordered are listed, but only abnormal results are displayed) Labs Reviewed  RESP PANEL BY RT-PCR (RSV, FLU A&B, COVID)  [id number]    EKG None  Radiology No results found.  Procedures Procedures    Medications Ordered in ED Medications - No data to display  ED Course/ Medical Decision Making/ A&P                                 Medical Decision Making This patient presents to the ED for concern of flulike symptoms, this involves an extensive number of treatment options, and is a complaint that carries with it a high risk of complications and morbidity.  The differential diagnosis includes flu, COVID, RSV  My initial workup includes respiratory panel  Additional history obtained from: Nursing notes from this visit.  I ordered, reviewed and interpreted labs which include: Respiratory panel negative  43 year old female presenting to the ED for evaluation of flulike symptoms.  Symptoms began approximately 2 days ago.  She is wondering if she has a sinus infection or allergies.  She appears very well on physical exam.  Respiratory panel negative, suspect other viral URI. She was educated on supportive care. She was encouraged to begin claritin and flonase . She was encouraged to follow up in one week. She was given return precautions. Stable at discharge.  At this time there does not appear to be any evidence of an acute emergency medical condition and the patient appears stable for discharge with appropriate outpatient follow up. Diagnosis was discussed with patient who verbalizes understanding of care plan and is agreeable to discharge. I have discussed return precautions with patient who verbalizes understanding. Patient encouraged to follow-up with their PCP within 1 week. All questions answered.  Note: Portions of this report may have been transcribed using voice  recognition software. Every effort was made to ensure accuracy; however, inadvertent computerized transcription errors may still be present.         Final Clinical Impression(s) / ED Diagnoses Final diagnoses:  Viral URI with cough    Rx / DC Orders ED Discharge Orders     None         Edwardo Marsa HERO, DEVONNA 07/15/23 1511    Bernard Drivers, MD 07/16/23 1240

## 2024-06-18 ENCOUNTER — Other Ambulatory Visit: Payer: Self-pay

## 2024-06-18 ENCOUNTER — Emergency Department (HOSPITAL_BASED_OUTPATIENT_CLINIC_OR_DEPARTMENT_OTHER)
Admission: EM | Admit: 2024-06-18 | Discharge: 2024-06-18 | Disposition: A | Discharge status: Home / Self Care | Attending: Emergency Medicine | Admitting: Emergency Medicine

## 2024-06-18 ENCOUNTER — Encounter (HOSPITAL_BASED_OUTPATIENT_CLINIC_OR_DEPARTMENT_OTHER): Payer: Self-pay

## 2024-06-18 DIAGNOSIS — R059 Cough, unspecified: Secondary | ICD-10-CM | POA: Diagnosis present

## 2024-06-18 DIAGNOSIS — J069 Acute upper respiratory infection, unspecified: Secondary | ICD-10-CM | POA: Diagnosis not present

## 2024-06-18 LAB — GROUP A STREP BY PCR: Group A Strep by PCR: NOT DETECTED

## 2024-06-18 NOTE — ED Triage Notes (Signed)
 POV Sore throat and nasal congestion that started Saturday. Denies N/V/D. Pt states I want to rule out flu because people at work have been sick.

## 2024-06-18 NOTE — ED Provider Notes (Signed)
 " [place] [facility] AT [facility] Provider Note   CSN: [id number] Arrival date & time: 06/18/24  9495     Patient presents with: Sore Throat and Nasal Congestion   Darlene Osborn is a 44 y.o. female.   Patient is a 44 year old female presenting with complaints of congestion, sore throat, mild cough.  Symptoms have been ongoing for the past 2 days.  She has been around sick individuals at work.  She denies any fevers or chills.       Prior to Admission medications  Medication Sig Start Date End Date Taking? Authorizing Provider  acetaminophen  (TYLENOL ) 500 MG tablet Take 1,000 mg by mouth daily.    [provider]  azithromycin  (ZITHROMAX  Z-PAK) 250 MG tablet Take 1 tablet (250 mg total) by mouth daily. Take two tablets (500 mg total) on day one, then take on tablet (250 mg) once daily. 09/07/22   Horton, Roxie HERO, DO  benzonatate  (TESSALON ) 100 MG capsule Take 1 capsule (100 mg total) by mouth every 8 (eight) hours. 10/09/22   Redwine, Madison A, PA-C  cyclobenzaprine  (FLEXERIL ) 10 MG tablet Take 1 tablet (10 mg total) by mouth at bedtime. 11/28/21   White, Shelba SAUNDERS, NP  fluticasone  (FLONASE ) 50 MCG/ACT nasal spray Place 1 spray into both nostrils daily. 09/07/22   Horton, Kristie M, DO  levonorgestrel (MIRENA) 20 MCG/24HR IUD 1 each by Intrauterine route once.    [provider]  predniSONE  (DELTASONE ) 20 MG tablet Take 2 tablets (40 mg total) by mouth daily. 11/28/21   Teresa Shelba SAUNDERS, NP    Allergies: Iodine    Review of Systems  All other systems reviewed and are negative.   Updated Vital Signs BP 122/81 (BP Location: Left Arm)   Pulse 87   Temp 98.7 F (37.1 C) (Oral)   Resp 18   LMP  (LMP Unknown)   SpO2 100%   Physical Exam Vitals and nursing note reviewed.  Constitutional:      General: She is not in acute distress.    Appearance: She is well-developed. She is not diaphoretic.  HENT:     Head: Normocephalic and  atraumatic.     Right Ear: Tympanic membrane normal.     Left Ear: Tympanic membrane normal.     Nose: No congestion.     Mouth/Throat:     Mouth: Mucous membranes are moist.     Tonsils: No tonsillar exudate or tonsillar abscesses.  Cardiovascular:     Rate and Rhythm: Normal rate and regular rhythm.     Heart sounds: No murmur heard.    No friction rub. No gallop.  Pulmonary:     Effort: Pulmonary effort is normal. No respiratory distress.     Breath sounds: Normal breath sounds. No wheezing.  Abdominal:     General: Bowel sounds are normal. There is no distension.     Palpations: Abdomen is soft.     Tenderness: There is no abdominal tenderness.  Musculoskeletal:        General: Normal range of motion.     Cervical back: Normal range of motion and neck supple.  Skin:    General: Skin is warm and dry.  Neurological:     General: No focal deficit present.     Mental Status: She is alert and oriented to person, place, and time.     (all labs ordered are listed, but only abnormal results are displayed) Labs Reviewed  GROUP A STREP BY PCR  EKG: None  Radiology: No results found.   Procedures   Medications Ordered in the ED - No data to display                                  Medical Decision Making  Patient presenting with a 2-day history of URI symptoms.  Strep test today is negative.  Physical examination is unremarkable and there is no hypoxia.  No indication for flu testing.  Patient to be discharged with over-the-counter medications, plenty of fluids, and follow-up as needed.     Final diagnoses:  None    ED Discharge Orders     None          Geroldine Berg, MD 06/18/24 [phone]  "

## 2024-06-18 NOTE — Discharge Instructions (Signed)
 Drink plenty of fluids and get plenty of rest.  Take over-the-counter medications as needed for relief of symptoms.  Return to the emergency department if symptoms significantly worsen or change.
